# Patient Record
Sex: Female | Born: 1965 | Race: White | Hispanic: No | Marital: Married | Smoking: Light tobacco smoker
Health system: Southern US, Community
[De-identification: ages and names within clinical notes are randomized; demographics above are authoritative.]

## PROBLEM LIST (undated history)

## (undated) DIAGNOSIS — K219 Gastro-esophageal reflux disease without esophagitis: Secondary | ICD-10-CM

## (undated) DIAGNOSIS — R030 Elevated blood-pressure reading, without diagnosis of hypertension: Secondary | ICD-10-CM

## (undated) DIAGNOSIS — Z86010 Personal history of colonic polyps: Secondary | ICD-10-CM

## (undated) DIAGNOSIS — M6289 Other specified disorders of muscle: Secondary | ICD-10-CM

## (undated) DIAGNOSIS — IMO0002 Reserved for concepts with insufficient information to code with codable children: Secondary | ICD-10-CM

## (undated) HISTORY — DX: Personal history of colonic polyps: Z86.010

## (undated) HISTORY — DX: Elevated blood-pressure reading, without diagnosis of hypertension: R03.0

## (undated) HISTORY — DX: Reserved for concepts with insufficient information to code with codable children: IMO0002

## (undated) HISTORY — DX: Gastro-esophageal reflux disease without esophagitis: K21.9

## (undated) HISTORY — DX: Other specified disorders of muscle: M62.89

---
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
## 1993-11-22 HISTORY — PX: OTHER SURGICAL HISTORY: SHX169

## 2009-09-18 ENCOUNTER — Encounter: Payer: Self-pay | Admitting: Family Medicine

## 2009-09-18 ENCOUNTER — Ambulatory Visit: Payer: Self-pay | Admitting: Family Medicine

## 2009-09-18 DIAGNOSIS — K219 Gastro-esophageal reflux disease without esophagitis: Secondary | ICD-10-CM | POA: Insufficient documentation

## 2009-09-18 DIAGNOSIS — Z860100 Personal history of colon polyps, unspecified: Secondary | ICD-10-CM | POA: Insufficient documentation

## 2009-09-18 DIAGNOSIS — I10 Essential (primary) hypertension: Secondary | ICD-10-CM | POA: Insufficient documentation

## 2009-09-18 DIAGNOSIS — E282 Polycystic ovarian syndrome: Secondary | ICD-10-CM | POA: Insufficient documentation

## 2009-09-18 DIAGNOSIS — G43009 Migraine without aura, not intractable, without status migrainosus: Secondary | ICD-10-CM | POA: Insufficient documentation

## 2009-09-18 DIAGNOSIS — IMO0002 Reserved for concepts with insufficient information to code with codable children: Secondary | ICD-10-CM | POA: Insufficient documentation

## 2009-09-18 DIAGNOSIS — Z9189 Other specified personal risk factors, not elsewhere classified: Secondary | ICD-10-CM | POA: Insufficient documentation

## 2010-03-02 ENCOUNTER — Ambulatory Visit: Payer: Self-pay | Admitting: Family Medicine

## 2010-03-02 DIAGNOSIS — M79609 Pain in unspecified limb: Secondary | ICD-10-CM | POA: Insufficient documentation

## 2010-04-09 ENCOUNTER — Ambulatory Visit: Payer: Self-pay | Admitting: Family Medicine

## 2010-04-09 ENCOUNTER — Other Ambulatory Visit: Admission: RE | Admit: 2010-04-09 | Discharge: 2010-04-09 | Payer: Self-pay | Admitting: Family Medicine

## 2010-04-09 DIAGNOSIS — N920 Excessive and frequent menstruation with regular cycle: Secondary | ICD-10-CM | POA: Insufficient documentation

## 2010-04-09 DIAGNOSIS — IMO0001 Reserved for inherently not codable concepts without codable children: Secondary | ICD-10-CM | POA: Insufficient documentation

## 2010-04-10 LAB — CONVERTED CEMR LAB
ALT: 28 units/L (ref 0–35)
AST: 22 units/L (ref 0–37)
Alkaline Phosphatase: 47 units/L (ref 39–117)
BUN: 18 mg/dL (ref 6–23)
Chloride: 100 meq/L (ref 96–112)
Cholesterol: 147 mg/dL (ref 0–200)
GFR calc non Af Amer: 112.43 mL/min (ref >60)
LDL Cholesterol: 86 mg/dL (ref 0–99)
Sodium: 137 meq/L (ref 135–145)
Triglycerides: 167 mg/dL — ABNORMAL HIGH (ref 0.0–149.0)

## 2010-04-16 ENCOUNTER — Encounter: Payer: Self-pay | Admitting: Family Medicine

## 2010-09-10 ENCOUNTER — Encounter (INDEPENDENT_AMBULATORY_CARE_PROVIDER_SITE_OTHER): Payer: Self-pay | Admitting: *Deleted

## 2010-10-12 ENCOUNTER — Telehealth: Payer: Self-pay | Admitting: Family Medicine

## 2010-11-12 ENCOUNTER — Telehealth: Payer: Self-pay | Admitting: Family Medicine

## 2010-12-13 ENCOUNTER — Encounter: Payer: Self-pay | Admitting: Family Medicine

## 2010-12-22 NOTE — Progress Notes (Signed)
Summary: tramadol   Phone Note Refill Request Message from:  Fax from Pharmacy on October 12, 2010 9:51 AM  Refills Requested: Medication #1:  TRAMADOL HCL 50 MG  TABS 1 tab by mouth two times a day as needed pain   Last Refilled: 09/03/2010 [facility] [phone].   Initial call taken by: Melody Comas,  October 12, 2010 9:52 AM  Follow-up for Phone Call        sent. Follow-up by: Crawford Givens MD,  October 13, 2010 3:01 PM    Prescriptions: TRAMADOL HCL 50 MG  TABS (TRAMADOL HCL) 1 tab by mouth two times a day as needed pain  #60 x 3   Entered and Authorized by:   Crawford Givens MD   Signed by:   Crawford Givens MD on 10/13/2010   Method used:   Electronically to        [facility] (retail)       [address]       [place], [place]  [address]       Ph: [phone]       Fax: [phone]   RxID:   [id number]

## 2010-12-22 NOTE — Assessment & Plan Note (Signed)
Summary: CPX,TRANSFER FROM [facility]   Vital Signs:  Patient profile:   45 year old female Height:      65 inches Weight:      252.13 pounds BMI:     42.11 Temp:     98.3 degrees F oral Pulse rate:   74 / minute Pulse rhythm:   regular BP sitting:   122 / 90  (left arm) Cuff size:   large  Vitals Entered By: Linde Gillis CMA Duncan Dull) (Apr 09, 2010 8:22 AM) CC: complete physical with pap, transfer from [place]   History of Present Illness: 45 yo here to establish care/CPX.  G0, four adopted children. Has very strong FH of endometrial cancer, mom died in 9s, grandmother and sister had endometrial CA as well. Has h/o menorrhagia s/p wedge rescetion in 1995. H/o abnormal pap smear over 10 years ago. She is very interested in prophylactic hysterectomy.  Fibromyalgia- has been on multiple antidepressants at first diagnosis, nothing helped. Pain in shoulder, thighs.  Combination of achy, sometimes burning, tingling. Taking 2400 mg of Ibuprofen daily.  Well woman- due for mammogram, pap, lipid panel.    Current Medications (verified): 1)  Toprol Xl 50 Mg Xr24h-Tab (Metoprolol Succinate) .... One A Day 2)  Hydrochlorothiazide 25 Mg Tabs (Hydrochlorothiazide) .... Once Daily 3)  Baby Aspirin 81 Mg Chew (Aspirin) .... One A Day 4)  Ibuprofen 200 Mg Tabs (Ibuprofen) .... She Takes 800-1000mg  As Needed 5)  Tramadol Hcl 50 Mg  Tabs (Tramadol Hcl) .Marland Kitchen.. 1 Tab By Mouth Two Times A Day As Needed Pain  Allergies: 1)  ! Penicillin 2)  ! Neomycin 3)  ! Entex  Past History:  Past Medical History: Last updated: 09/18/2009 Current Problems:  COLONIC POLYPS, HX OF (ICD-V12.72) ELEVATED BLOOD PRESSURE WITHOUT DIAGNOSIS OF HYPERTENSION (ICD-796.2) GERD (ICD-530.81) COMMON MIGRAINE (ICD-346.10) CHICKENPOX, HX OF (ICD-V15.9)  Pelvic floor dysfunction dengenerative disc disease  Past Surgical History: Last updated: 09/18/2009 knee, right...benign tumor 1982 sinus 1984 ovarian wedge  resection 1995 tonsil and adenoids 1979 MRI 2010 : lumbar DDD, no surgical indication.  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 Married Never Smoked Alcohol use-yes Drug use-no Regular exercise-no Diet: healthy, all natural.  Risk Factors: Exercise: no (09/18/2009)  Risk Factors: Smoking Status: never (09/18/2009)  Family History: Family History of Arthritis Family History Ovarian cancer Family History Uterine cancer- mother, sister, grandmother Child with mitochondrial disease...adopted son..complex 1 and 4 MGM: breast cancer, colon cancer, multiple myeloma  Social History: Reviewed history from 09/18/2009 and no changes required. 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 Married Never Smoked Alcohol use-yes Drug use-no Regular exercise-no Diet: healthy, all natural.  Review of Systems      See HPI General:  Complains of fatigue. Eyes:  Denies blurring. ENT:  Denies difficulty swallowing. CV:  Denies chest pain or discomfort. Resp:  Denies shortness of breath. GI:  Denies abdominal pain, change in bowel habits, nausea, and vomiting. GU:  Denies abnormal vaginal bleeding, discharge, and dysuria. MS:  Denies joint pain, joint redness, and joint swelling. Derm:  Denies rash. Neuro:  Denies headaches. Psych:  Denies anxiety, depression, sense of great danger, suicidal thoughts/plans, thoughts of violence, unusual visions or sounds, and thoughts /plans of harming others. Endo:  Denies cold intolerance and heat intolerance. Heme:  Denies  abnormal bruising.  Physical Exam  General:  obese appearing female in NAd Head:  normocephalic and  atraumatic.   Eyes:  vision grossly intact, pupils equal, and pupils round.   Ears:  R ear normal and L ear normal.   Nose:  no external deformity.   Mouth:  good dentition and no gingival abnormalities.   Breasts:  No mass, nodules, thickening, tenderness, bulging, retraction, inflamation, nipple discharge or skin changes noted.   Lungs:  Normal respiratory effort, chest expands symmetrically. Lungs are clear to auscultation, no crackles or wheezes. Heart:  Normal rate and regular rhythm. S1 and S2 normal without gallop, murmur, click, rub or other extra sounds. Abdomen:  Bowel sounds positive,abdomen soft and non-tender without masses, organomegaly or hernias noted. Genitalia:  Pelvic Exam:        External: normal female genitalia without lesions or masses        Vagina: normal without lesions or masses        Cervix: normal without lesions or masses        Adnexa: normal bimanual exam without masses or fullness        Uterus: normal by palpation        Pap smear: performed Extremities:  No clubbing, cyanosis, edema, or deformity noted with normal full range of motion of all joints.   Neurologic:  cranial nerves II-XII intact, strength normal in all extremities, and sensation intact to light touch.   Skin:  Intact without suspicious lesions or rashes Psych:  Cognition and judgment appear intact. Alert and cooperative with normal attention span and concentration. No apparent delusions, illusions, hallucinations   Impression & Recommendations:  Problem # 1:  Preventive Health Care (ICD-V70.0) Reviewed preventive care protocols, scheduled due services, and updated immunizations Discussed nutrition, exercise, diet, and healthy lifestyle.  Pap, FLP, hepatic panel, BMET today. Set up mammogram today.  Problem # 2:  ESSENTIAL HYPERTENSION, BENIGN (ICD-401.1) Assessment: Unchanged Stable.  Continue current meds. Her updated medication list for this problem includes:    Toprol Xl 50 Mg  Xr24h-tab (Metoprolol succinate) ..... One a day    Hydrochlorothiazide 25 Mg Tabs (Hydrochlorothiazide) ..... Once daily  Orders: Venipuncture (21308) TLB-BMP (Basic Metabolic Panel-BMET) (80048-METABOL)  Problem # 3:  MENORRHAGIA (ICD-626.2) Assessment: Deteriorated Worsening along with pt desire to have prophylactic hysterectomy (stong FH endometrial CA).  Will refer to gyn. Orders: Gynecologic Referral (Gyn)  Problem # 4:  UNSPECIFIED MYALGIA AND MYOSITIS (ICD-729.1) Discussed Fibromyalgia, given pt handout concerning exercises, supplements. Check Vit D. Advised against using so much Ibuprofen, will try Tramadol. Her updated medication list for this problem includes:    Baby Aspirin 81 Mg Chew (Aspirin) ..... One a day    Ibuprofen 200 Mg Tabs (Ibuprofen) ..... She takes 800-1000mg  as needed    Tramadol Hcl 50 Mg Tabs (Tramadol hcl) .Marland Kitchen... 1 tab by mouth two times a day as needed pain  Orders: Venipuncture (65784) T-Vitamin D (25-Hydroxy) (69629-52841)  Complete Medication List: 1)  Toprol Xl 50 Mg Xr24h-tab (Metoprolol succinate) .... One a day 2)  Hydrochlorothiazide 25 Mg Tabs (Hydrochlorothiazide) .... Once daily 3)  Baby Aspirin 81 Mg Chew (Aspirin) .... One a day 4)  Ibuprofen 200 Mg Tabs (Ibuprofen) .... She takes 800-1000mg  as needed 5)  Tramadol Hcl 50 Mg Tabs (Tramadol hcl) .Marland Kitchen.. 1 tab by mouth two times a day as needed pain  Other Orders: Pap Smear, Thin Prep ( Collection of) (L2440) TLB-Lipid Panel (80061-LIPID) TLB-Hepatic/Liver Function Pnl (80076-HEPATIC) Radiology Referral (Radiology)  Patient Instructions: 1)  Great to see you. 2)  Please stop by to see Shirlee Limerick on your way out to set up your mammogram and Gyn referral. Prescriptions: TRAMADOL HCL 50 MG  TABS (TRAMADOL HCL) 1 tab by mouth two times a day as needed pain  #60 x 3   Entered and Authorized by:   Ruthe Mannan MD   Signed by:   Ruthe Mannan MD on 04/09/2010   Method used:   Electronically to         [facility] (retail)       [address]       [place], [place]  [address]       Ph: [phone]       Fax: [phone]   RxID:   [id number]   Current Allergies (reviewed today): ! PENICILLIN ! NEOMYCIN ! ENTEX

## 2010-12-22 NOTE — Letter (Signed)
Summary: Primary Care Appointment Letter  [place] [facility]  [address] [place], [place] [address]   Phone: [phone]  Fax: [phone]    09/10/2010 MRN: [id number]  [facility] [address] [place]  [address]  Dear Ms. Laffey,   Your Primary Care Physician Kerby Nora MD has indicated that:    _______it is time to schedule an appointment.    __x_____you missed your appointment for your Mammogram and need to call           and reschedule.    _______you need to have lab work done.    _______you need to schedule an appointment discuss lab or test results.    _______you need to call to reschedule your appointment that is                       scheduled on _________.     Please call our office as soon as possible. Our phone number is [phone]. Please press option 1. Our office is open 8a-12noon and 1p-5p, Monday through Friday.     Thank you,    [place] Sched[facility]

## 2010-12-22 NOTE — Assessment & Plan Note (Signed)
Summary: high blood pressure/alc   Vital Signs:  Patient profile:   45 year old female Height:      65 inches Weight:      252.13 pounds BMI:     42.11 Temp:     98.2 degrees F oral Pulse rate:   84 / minute Pulse rhythm:   regular BP sitting:   156 / 94  (left arm) Cuff size:   large  Vitals Entered By: Delilah Shan CMA Duncan Dull) (March 02, 2010 3:56 PM)  Serial Vital Signs/Assessments:  Time      Position  BP       Pulse  Resp  Temp     By                     124/83                         Ruthe Mannan MD  CC: High BP - Requests EKG   History of Present Illness: 45 yo new to me here for ? elevated blood pressure. She wants an EKG because her arm was tingling earlier today (left), now resolved. no CP or SOB.  No h/o CAD.  BP at home today was 111/71.  Rushed in today was very nervous.  Repeat BP was normal  BP has been well controlled with HCTZ 25 mg daily and Toprol XL 50 mg daily.  Current Medications (verified): 1)  Toprol Xl 50 Mg Xr24h-Tab (Metoprolol Succinate) .... One A Day 2)  Hydrochlorothiazide 25 Mg Tabs (Hydrochlorothiazide) .... Once Daily 3)  Baby Aspirin 81 Mg Chew (Aspirin) .... One A Day 4)  Ibuprofen 200 Mg Tabs (Ibuprofen) .... She Takes 800-1000mg  As Needed  Allergies: 1)  ! Penicillin 2)  ! Neomycin 3)  ! Entex  Review of Systems      See HPI CV:  Denies chest pain or discomfort, difficulty breathing at night, fainting, lightheadness, and shortness of breath with exertion. GI:  Denies nausea and vomiting.  Physical Exam  General:  obese appearing female in NAd Lungs:  Normal respiratory effort, chest expands symmetrically. Lungs are clear to auscultation, no crackles or wheezes. Heart:  Normal rate and regular rhythm. S1 and S2 normal without gallop, murmur, click, rub or other extra sounds. Msk:  bilateral normal UE strength, full ROM. Neg empty can, neg arch. Extremities:  no edema Psych:  Cognition and judgment appear intact. Alert and  cooperative with normal attention span and concentration. No apparent delusions, illusions, hallucinations   Impression & Recommendations:  Problem # 1:  ESSENTIAL HYPERTENSION, BENIGN (ICD-401.1) Assessment Unchanged stable.  Reassurance provided.  Continue current meds. Her updated medication list for this problem includes:    Toprol Xl 50 Mg Xr24h-tab (Metoprolol succinate) ..... One a day    Hydrochlorothiazide 25 Mg Tabs (Hydrochlorothiazide) ..... Once daily  Problem # 2:  ARM PAIN, LEFT (ICD-729.5) Assessment: New  Resolved. Reassurance provided.  EKG normal.  Red flags given requiring immediate evalutation.  Orders: EKG w/ Interpretation (93000)  Complete Medication List: 1)  Toprol Xl 50 Mg Xr24h-tab (Metoprolol succinate) .... One a day 2)  Hydrochlorothiazide 25 Mg Tabs (Hydrochlorothiazide) .... Once daily 3)  Baby Aspirin 81 Mg Chew (Aspirin) .... One a day 4)  Ibuprofen 200 Mg Tabs (Ibuprofen) .... She takes 800-1000mg  as needed  Current Allergies (reviewed today): ! PENICILLIN ! NEOMYCIN ! ENTEX

## 2010-12-22 NOTE — Letter (Signed)
Summary: Results Follow up Letter  [place] [facility]  [address] [place], [place] [address]   Phone: [phone]  Fax: [phone]    04/16/2010 MRN: [id number]  [facility] [address], [place]  [address]  Dear Ms. Harsha,  The following are the results of your recent test(s):  Test         Result    Pap Smear:        Normal __X___  Not Normal _____ Comments: ______________________________________________________ Cholesterol: LDL(Bad cholesterol):         Your goal is less than:         HDL (Good cholesterol):       Your goal is more than: Comments:  ______________________________________________________ Mammogram:        Normal _____  Not Normal _____ Comments:  ___________________________________________________________________ Hemoccult:        Normal _____  Not normal _______ Comments:    _____________________________________________________________________ Other Tests:    We routinely do not discuss normal results over the telephone.  If you desire a copy of the results, or you have any questions about this information we can discuss them at your next office visit.   Sincerely,        Ruthe Mannan, MD

## 2010-12-24 NOTE — Progress Notes (Signed)
Summary: Pt has not responded to Mammogram referral...  Phone Note Outgoing Call   Summary of Call: Pt has been called and ltr mailed regarding rescheduled mammogram.  Pt has not called up back.Marland KitchenMarland KitchenDaine Gip  November 12, 2010 2:57 PM  Initial call taken by: Daine Gip,  November 12, 2010 2:57 PM  Follow-up for Phone Call        Aware. Follow-up by: Kerby Nora MD,  November 16, 2010 12:25 AM

## 2011-05-11 ENCOUNTER — Encounter: Payer: Self-pay | Admitting: Family Medicine

## 2011-05-11 LAB — HM PAP SMEAR

## 2011-05-13 ENCOUNTER — Encounter: Payer: Self-pay | Admitting: Family Medicine

## 2011-05-13 ENCOUNTER — Ambulatory Visit (INDEPENDENT_AMBULATORY_CARE_PROVIDER_SITE_OTHER): Payer: BC Managed Care – PPO | Admitting: Family Medicine

## 2011-05-13 VITALS — BP 120/82 | HR 86 | Temp 98.3°F | Wt 232.5 lb

## 2011-05-13 DIAGNOSIS — Z113 Encounter for screening for infections with a predominantly sexual mode of transmission: Secondary | ICD-10-CM

## 2011-05-13 DIAGNOSIS — Z309 Encounter for contraceptive management, unspecified: Secondary | ICD-10-CM | POA: Insufficient documentation

## 2011-05-13 DIAGNOSIS — E282 Polycystic ovarian syndrome: Secondary | ICD-10-CM

## 2011-05-13 DIAGNOSIS — N920 Excessive and frequent menstruation with regular cycle: Secondary | ICD-10-CM

## 2011-05-13 MED ORDER — NORETHINDRONE 0.35 MG PO TABS: 1.0000 | ORAL_TABLET | Freq: Every day | ORAL | Status: DC

## 2011-05-13 NOTE — Progress Notes (Signed)
45 yo here to discuss OCPs.  Divorced from husband, has new boyfriend. She has had issues with infertility but wants to start OCPs to make sure they don't get pregnant. Periods are irregular, not too heavy at this point. She is a non smoker. Tried Loestrin in past, made her vomit.  Patient Active Problem List  Diagnoses  . POLYCYSTIC OVARIAN DISEASE  . COMMON MIGRAINE  . ESSENTIAL HYPERTENSION, BENIGN  . GERD  . MENORRHAGIA  . DEGENERATIVE DISC DISEASE  . UNSPECIFIED MYALGIA AND MYOSITIS  . ARM PAIN, LEFT  . COLONIC POLYPS, HX OF  . CHICKENPOX, HX OF  . Contraception management   Past Medical History  Diagnosis Date  . Personal history of colonic polyps   . Elevated blood pressure reading without diagnosis of hypertension   . Esophageal reflux   . DDD (degenerative disc disease)   . Pelvic floor dysfunction    Past Surgical History  Procedure Date  . 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 Sinus surgery with instatrak 1984  . Ovarian wedge resection 1995  . Tonsillectomy and adenoidectomy 1979   History  Substance Use Topics  . Smoking status: Never Smoker   . Smokeless tobacco: Not on file  . Alcohol Use: Yes   Family History  Problem Relation Age of Onset  . Cancer Mother     uterine  . Cancer Sister     uterine  . Cancer Maternal Grandmother     uterine, breast, colon, multiple myeloma  . Cancer Other     ovarian  . Arthritis Other    Allergies  Allergen Reactions  . Entex   . Neomycin   . Penicillins    Current Outpatient Prescriptions on File Prior to Visit  Medication Sig Dispense Refill  . BABY ASPIRIN PO Take 1 tablet by mouth daily.        . cholecalciferol (VITAMIN D) 1000 UNITS tablet Take 1,000 Units by mouth daily.        . hydrochlorothiazide 25 MG tablet Take 25 mg by mouth daily.        Marland Kitchen ibuprofen (ADVIL,MOTRIN) 200 MG tablet Take 800-1000mg  as needed       . metoprolol (TOPROL-XL) 50 MG 24 hr tablet Take 50 mg by mouth daily.         . traMADol (ULTRAM) 50 MG tablet Take 50 mg by mouth 2 (two) times daily as needed.         The PMH, PSH, Social History, Family History, Medications, and allergies have been reviewed in [facility], and have been updated if relevant.   Review of Systems  See HPI  General: Complains of fatigue.  Eyes: Denies blurring.  ENT: Denies difficulty swallowing.  CV: Denies chest pain or discomfort.  Resp: Denies shortness of breath.  GI: Denies abdominal pain, change in bowel habits, nausea, and vomiting.  GU: Denies abnormal vaginal bleeding, discharge, and dysuria.  MS: Denies joint pain, joint redness, and joint swelling.  Derm: Denies rash.  Neuro: Denies headaches.  Psych: Denies anxiety, depression, sense of great danger, suicidal thoughts/plans, thoughts of violence, unusual visions or sounds, and thoughts /plans of harming others.  Endo: Denies cold intolerance and heat intolerance.  Heme: Denies abnormal bruising.   Physical exam: BP 120/82  Pulse 86  Temp(Src) 98.3 F (36.8 C) (Oral)  Wt 232 lb 8 oz (105.461 kg)  LMP 04/25/2011  General: obese appearing female in NAd  Psych: Cognition and judgment appear intact.  Alert and cooperative with normal attention span and concentration. No apparent delusions, illusions, hallucinations   1 Contraception management  Discussed different options. >25 min spent with face to face with patient, >50% counseling and/or coordinating care- she would like to try minipill. Rx given.     2. Screening for STD (sexually transmitted disease)  RPR, HIV Antibody ( Reflex)

## 2011-05-14 LAB — HIV ANTIBODY (ROUTINE TESTING W REFLEX): HIV: NONREACTIVE

## 2011-07-05 ENCOUNTER — Telehealth: Payer: Self-pay | Admitting: *Deleted

## 2011-07-05 NOTE — Telephone Encounter (Signed)
Returned pt's phone call. She cannot tolerate anything with estrogen.  I recommended that she see her GYN to consider paraguard. She agree and will call. She is no longer depressed since she stopped the micronor.

## 2011-07-05 NOTE — Telephone Encounter (Signed)
Pt has been taking progesterone only BC pills but she says she cant tolerate these.  They are causing her to be depressed and she had a very heavy period.  She says she's willing to give more time regarding her periods, but she cant live with the depression.  She thinks maybe she should try something else.  She is asking that you call her regarding this.  [facility] [place].

## 2011-07-06 ENCOUNTER — Other Ambulatory Visit: Payer: Self-pay | Admitting: Family Medicine

## 2011-10-22 ENCOUNTER — Other Ambulatory Visit: Payer: Self-pay | Admitting: Family Medicine

## 2012-03-20 ENCOUNTER — Other Ambulatory Visit: Payer: Self-pay | Admitting: Family Medicine

## 2012-03-21 NOTE — Telephone Encounter (Signed)
Overdue for CPX.Marland Kitchenrefill once appt made to last until appt.

## 2012-04-19 ENCOUNTER — Encounter: Payer: BC Managed Care – PPO | Admitting: Family Medicine

## 2012-06-06 ENCOUNTER — Encounter: Payer: BC Managed Care – PPO | Admitting: Family Medicine

## 2012-06-06 DIAGNOSIS — Z0000 Encounter for general adult medical examination without abnormal findings: Secondary | ICD-10-CM | POA: Insufficient documentation

## 2012-06-07 ENCOUNTER — Other Ambulatory Visit: Payer: Self-pay | Admitting: Family Medicine

## 2012-09-29 ENCOUNTER — Telehealth: Payer: Self-pay | Admitting: Family Medicine

## 2012-09-29 NOTE — Telephone Encounter (Signed)
Appt scheduled for next week

## 2012-09-29 NOTE — Telephone Encounter (Signed)
Pt called and wants to know if she can get a CPE apptmt w/out waiting several weeks.  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 She wants to know if there is a way we could schedule her for a CPE for only a day or two out, instead of several weeks? Please advise. Thank you.

## 2012-09-29 NOTE — Telephone Encounter (Signed)
We can try but the schedule often fills up quickly during this time of year.  Ask her to call when she would like and we can try to fit her in.

## 2012-10-05 ENCOUNTER — Ambulatory Visit (INDEPENDENT_AMBULATORY_CARE_PROVIDER_SITE_OTHER): Payer: BC Managed Care – PPO | Admitting: Family Medicine

## 2012-10-05 ENCOUNTER — Encounter: Payer: Self-pay | Admitting: Family Medicine

## 2012-10-05 VITALS — BP 160/90 | HR 84 | Temp 98.9°F | Ht 64.5 in | Wt 214.0 lb

## 2012-10-05 DIAGNOSIS — R5383 Other fatigue: Secondary | ICD-10-CM

## 2012-10-05 DIAGNOSIS — Z309 Encounter for contraceptive management, unspecified: Secondary | ICD-10-CM

## 2012-10-05 DIAGNOSIS — Z1231 Encounter for screening mammogram for malignant neoplasm of breast: Secondary | ICD-10-CM

## 2012-10-05 DIAGNOSIS — Z136 Encounter for screening for cardiovascular disorders: Secondary | ICD-10-CM

## 2012-10-05 DIAGNOSIS — Z113 Encounter for screening for infections with a predominantly sexual mode of transmission: Secondary | ICD-10-CM

## 2012-10-05 DIAGNOSIS — Z0000 Encounter for general adult medical examination without abnormal findings: Secondary | ICD-10-CM

## 2012-10-05 DIAGNOSIS — R5381 Other malaise: Secondary | ICD-10-CM

## 2012-10-05 DIAGNOSIS — I10 Essential (primary) hypertension: Secondary | ICD-10-CM

## 2012-10-05 LAB — TSH: TSH: 1.14 u[IU]/mL (ref 0.35–5.50)

## 2012-10-05 LAB — COMPREHENSIVE METABOLIC PANEL
Albumin: 4.2 g/dL (ref 3.5–5.2)
BUN: 15 mg/dL (ref 6–23)
Calcium: 8.9 mg/dL (ref 8.4–10.5)
Chloride: 104 mEq/L (ref 96–112)
Creatinine, Ser: 0.7 mg/dL (ref 0.4–1.2)
Glucose, Bld: 104 mg/dL — ABNORMAL HIGH (ref 70–99)
Potassium: 4.1 mEq/L (ref 3.5–5.1)

## 2012-10-05 MED ORDER — NORETHIN-ETH ESTRAD-FE BIPHAS 1 MG-10 MCG / 10 MCG PO TABS: 1.0000 | ORAL_TABLET | Freq: Every day | ORAL | Status: DC

## 2012-10-05 MED ORDER — HYDROCHLOROTHIAZIDE 25 MG PO TABS: ORAL_TABLET | ORAL | Status: DC

## 2012-10-05 MED ORDER — METRONIDAZOLE 1 % EX GEL: Freq: Every day | CUTANEOUS | Status: AC

## 2012-10-05 NOTE — Patient Instructions (Addendum)
Good to see you. We will call you with your lab results.  Have fun!!!

## 2012-10-05 NOTE — Progress Notes (Signed)
Subjective:   HT Patient ID: Brittany Landry, female    DOB: 24-Mar-1966, 46 y.o.   MRN: [id number]  HPI 46 yo here for CPX.  G0, four adopted children.  Divorced and dating!  Doing well. Wants to be tested for HIV, RPR. No dysuria or vaginal discharge.  Has very strong FH of endometrial cancer, mom died in 63s, grandmother and sister had endometrial CA as well.  Has h/o menorrhagia s/p wedge rescetion in 1995.  H/o abnormal pap smear over 10 years ago.   Last pap smear done here was neg in 03/2010.  She states that she had a pap smear last June that was neg with OBGYN. On Lo loestrin for birth control.  HTN- BP elevated today.  On HCTZ 25 mg daily but ran out months ago. No HA, blurred vision, CP or SOB.     Well woman- due for mammogram,  lipid panel, flu shot.  Rash on her chin- noticed it several months ago.  A little itchy.  Hydrocortisone actually made it worse. Patient Active Problem List  Diagnosis  . POLYCYSTIC OVARIAN DISEASE  . COMMON MIGRAINE  . ESSENTIAL HYPERTENSION, BENIGN  . GERD  . MENORRHAGIA  . DEGENERATIVE DISC DISEASE  . UNSPECIFIED MYALGIA AND MYOSITIS  . ARM PAIN, LEFT  . COLONIC POLYPS, HX OF  . CHICKENPOX, HX OF  . Contraception management  . Routine general medical examination at a health care facility   Past Medical History  Diagnosis Date  . Personal history of colonic polyps   . Elevated blood pressure reading without diagnosis of hypertension   . Esophageal reflux   . DDD (degenerative disc disease)   . Pelvic floor dysfunction    Past Surgical History  Procedure Date  . 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 Sinus surgery with instatrak 1984  . Ovarian wedge resection 1995  . Tonsillectomy and adenoidectomy 1979   History  Substance Use Topics  . Smoking status: Never Smoker   . Smokeless tobacco: Not on file  . Alcohol Use: Yes   Family History  Problem Relation Age of Onset  . Cancer Mother     uterine  . Cancer Sister      uterine  . Cancer Maternal Grandmother     uterine, breast, colon, multiple myeloma  . Cancer Other     ovarian  . Arthritis Other    Allergies  Allergen Reactions  . Entex   . Neomycin   . Penicillins    Current Outpatient Prescriptions on File Prior to Visit  Medication Sig Dispense Refill  . BABY ASPIRIN PO Take 1 tablet by mouth daily.        . cholecalciferol (VITAMIN D) 1000 UNITS tablet Take 1,000 Units by mouth daily.        . hydrochlorothiazide (HYDRODIURIL) 25 MG tablet TAKE ONE TABLET BY MOUTH ONE TIME DAILY  30 tablet  0  . ibuprofen (ADVIL,MOTRIN) 200 MG tablet Take 800-1000mg  as needed       . metoprolol (TOPROL-XL) 50 MG 24 hr tablet Take 50 mg by mouth daily.        . norethindrone (ORTHO MICRONOR) 0.35 MG tablet Take 1 tablet (0.35 mg total) by mouth daily.  30 tablet  11  . traMADol (ULTRAM) 50 MG tablet Take 50 mg by mouth 2 (two) times daily as needed.         The PMH, PSH, Social History, Family History, Medications, and allergies have been  reviewed in [facility], and have been updated if relevant.     Review of Systems See HPI Patient reports no  vision/ hearing changes,anorexia, weight change, fever ,adenopathy, persistant / recurrent hoarseness, swallowing issues, chest pain, edema,persistant / recurrent cough, hemoptysis, dyspnea(rest, exertional, paroxysmal nocturnal), gastrointestinal  bleeding (melena, rectal bleeding), abdominal pain, excessive heart burn, GU symptoms(dysuria, hematuria, pyuria, voiding/incontinence  Issues) syncope, focal weakness, severe memory loss, concerning skin lesions, depression, anxiety, abnormal bruising/bleeding, major joint swelling, breast masses or abnormal vaginal bleeding.       Objective:   Physical Exam BP 160/90  Pulse 84  Temp 98.9 F (37.2 C)  Ht 5' 4.5" (1.638 m)  Wt 214 lb (97.07 kg)  BMI 36.17 kg/m2  General:  Well-developed,well-nourished,in no acute distress; alert,appropriate and cooperative throughout  examination Head:  normocephalic and atraumatic.   Eyes:  vision grossly intact, pupils equal, pupils round, and pupils reactive to light.   Ears:  R ear normal and L ear normal.   Nose:  no external deformity.   Mouth:  good dentition.   Neck:  No deformities, masses, or tenderness noted. Breasts:  No mass, nodules, thickening, tenderness, bulging, retraction, inflamation, nipple discharge or skin changes noted.   Lungs:  Normal respiratory effort, chest expands symmetrically. Lungs are clear to auscultation, no crackles or wheezes. Heart:  Normal rate and regular rhythm. S1 and S2 normal without gallop, murmur, click, rub or other extra sounds. Abdomen:  Bowel sounds positive,abdomen soft and non-tender without masses, organomegaly or hernias noted. Msk:  No deformity or scoliosis noted of thoracic or lumbar spine.   Extremities:  No clubbing, cyanosis, edema, or deformity noted with normal full range of motion of all joints.   Neurologic:  alert & oriented X3 and gait normal.   Skin:  Erythematous, raised macular rash on chin, a little on her cheeks bilaterally Cervical Nodes:  No lymphadenopathy noted Axillary Nodes:  No palpable lymphadenopathy Psych:  Cognition and judgment appear intact. Alert and cooperative with normal attention span and concentration. No apparent delusions, illusions, hallucinations        Assessment & Plan:   1. Routine general medical examination at a health care facility  Reviewed preventive care protocols, scheduled due services, and updated immunizations Discussed nutrition, exercise, diet, and healthy lifestyle.  Comprehensive metabolic panel  2. Contraception management  Continue Lo loestrin.   3. Essential hypertension, benign  Deteriorated.  Refilled HCTZ.  Check CMET. She is asymptomatic.   4. Other screening mammogram  MM Digital Screening  5. Screening for STD (sexually transmitted disease)  HIV Antibody, RPR  6. Screening for ischemic heart  disease  Lipid Panel  7. Rash ?Rosacea.  Will try topical metrogel. Call or return to clinic prn if these symptoms worsen or fail to improve as anticipated.

## 2012-10-06 LAB — RPR

## 2012-10-06 LAB — HIV ANTIBODY (ROUTINE TESTING W REFLEX): HIV: NONREACTIVE

## 2012-10-23 ENCOUNTER — Telehealth: Payer: Self-pay

## 2012-10-23 DIAGNOSIS — N6459 Other signs and symptoms in breast: Secondary | ICD-10-CM

## 2012-10-23 NOTE — Telephone Encounter (Signed)
Order entered

## 2012-10-23 NOTE — Telephone Encounter (Signed)
Pt left v/m needs diagnostic mammogram at [facility];pt said at 10/05/12 appt Dr Dayton Martes thought felt something in breast but decided milk gland;pt has noticed slight pulling sensation and inversion of nipple. Please advise.

## 2012-10-23 NOTE — Telephone Encounter (Signed)
Pt states left breast.

## 2012-10-23 NOTE — Telephone Encounter (Signed)
I can place order.  Please verify which breast.

## 2012-10-24 ENCOUNTER — Other Ambulatory Visit: Payer: Self-pay | Admitting: Family Medicine

## 2012-10-24 DIAGNOSIS — N6459 Other signs and symptoms in breast: Secondary | ICD-10-CM

## 2012-10-24 DIAGNOSIS — Z1239 Encounter for other screening for malignant neoplasm of breast: Secondary | ICD-10-CM

## 2012-10-25 ENCOUNTER — Ambulatory Visit: Payer: Self-pay | Admitting: Family Medicine

## 2012-10-27 ENCOUNTER — Telehealth: Payer: Self-pay | Admitting: Family Medicine

## 2012-10-27 ENCOUNTER — Encounter: Payer: Self-pay | Admitting: Family Medicine

## 2012-10-27 DIAGNOSIS — N6459 Other signs and symptoms in breast: Secondary | ICD-10-CM

## 2012-10-27 NOTE — Telephone Encounter (Signed)
Please call pt to let her know that I reviewed her mammogram and ultrasound from [facility], both of which were normal.  Because she did complain to the tech about nipple inversion and discomfort, they are recommending a surgical consultation for further evaluation.  This is just to make sure that we do not need to get a biopsy.  Referral placed.

## 2012-10-30 NOTE — Telephone Encounter (Signed)
Please follow up on this.  I think she was already informed. Thanks.

## 2012-10-31 NOTE — Telephone Encounter (Signed)
Appt made with [facility] Surgeon Dr Stephan Minister on 11/06/2012 and patient is aware.

## 2012-10-31 NOTE — Telephone Encounter (Signed)
Pt has been informed about this.

## 2012-11-07 ENCOUNTER — Encounter: Payer: Self-pay | Admitting: Family Medicine

## 2012-11-08 ENCOUNTER — Encounter: Payer: Self-pay | Admitting: Family Medicine

## 2013-05-15 ENCOUNTER — Other Ambulatory Visit: Payer: Self-pay | Admitting: Family Medicine

## 2013-09-19 IMAGING — US ULTRASOUND LEFT BREAST
1 series · 12 of 12 positions shown · non-contrast
Comparison: none

REASON FOR EXAM: LT BRST INVERTED NIPPLE AND YEARLY
COMMENTS:

PROCEDURE:     US  - US BREAST LEFT  - October 25, 2012  [DATE]
RESULT:     A directed ultrasound was performed of the periareolar renal or
retroareolar region of the left breast. No cystic or solid mass is
demonstrated. No suspicious ductal dilation is demonstrated. No shadowing
masses are seen.

[Series 1: ultrasound left breast · 0.08mm/px · 12 of 12 slices shown]
[im 1/12]
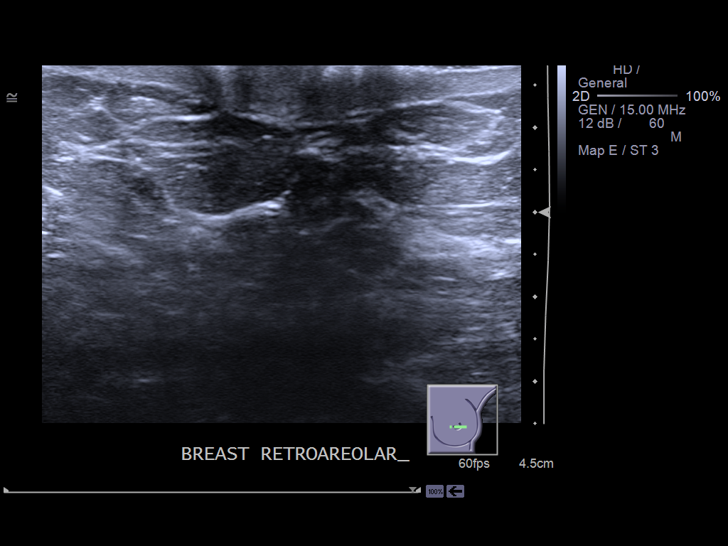
[im 2/12]
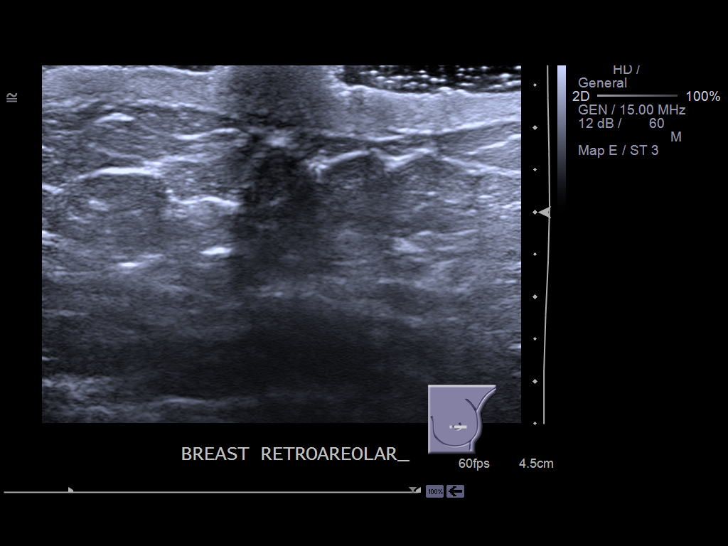
[im 3/12]
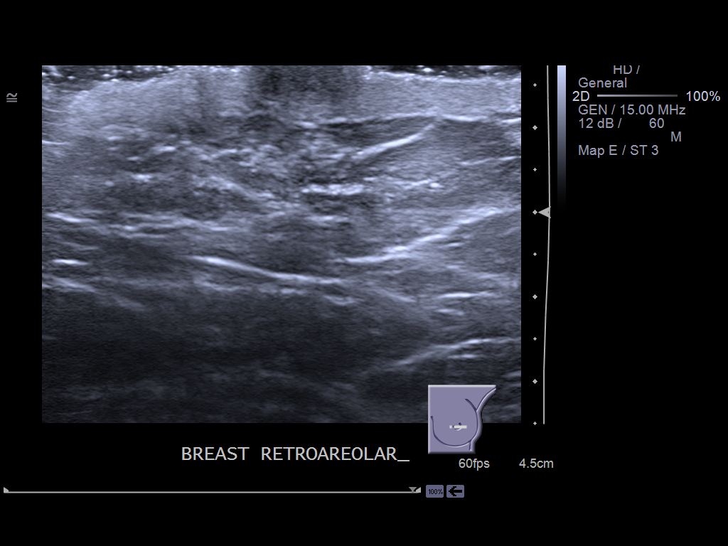
[im 4/12]
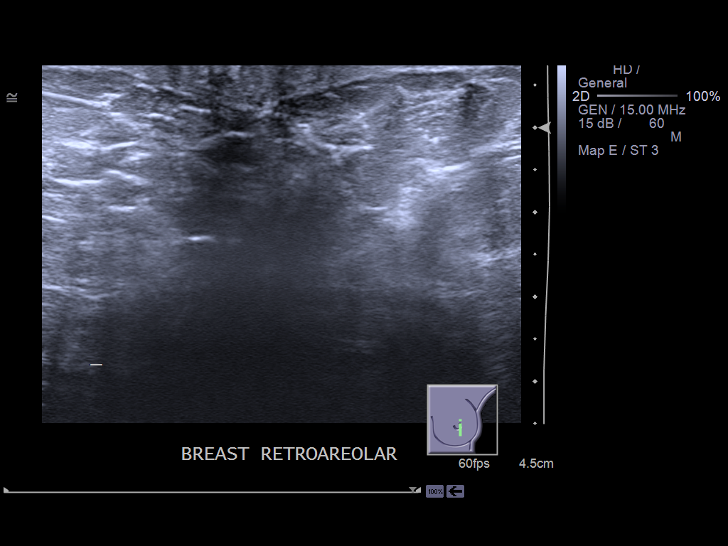
[im 5/12]
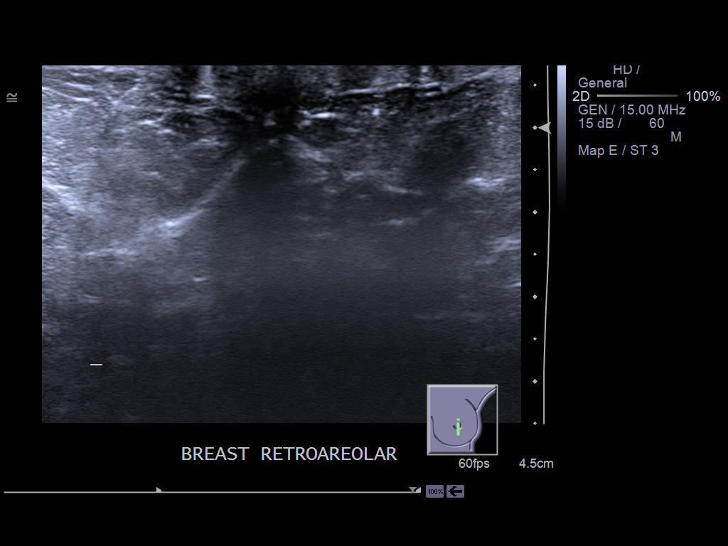
[im 6/12]
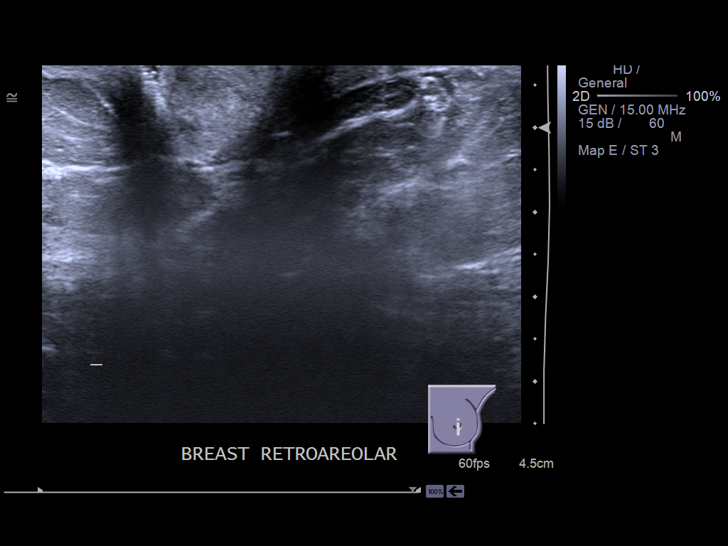
[im 7/12]
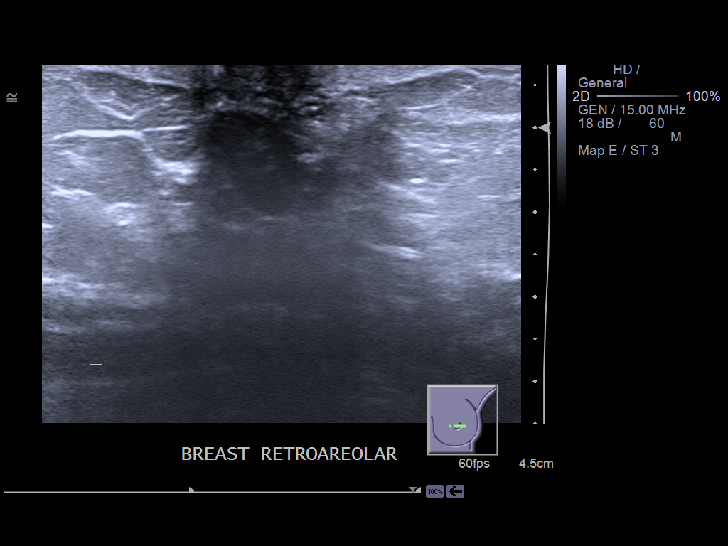
[im 8/12]
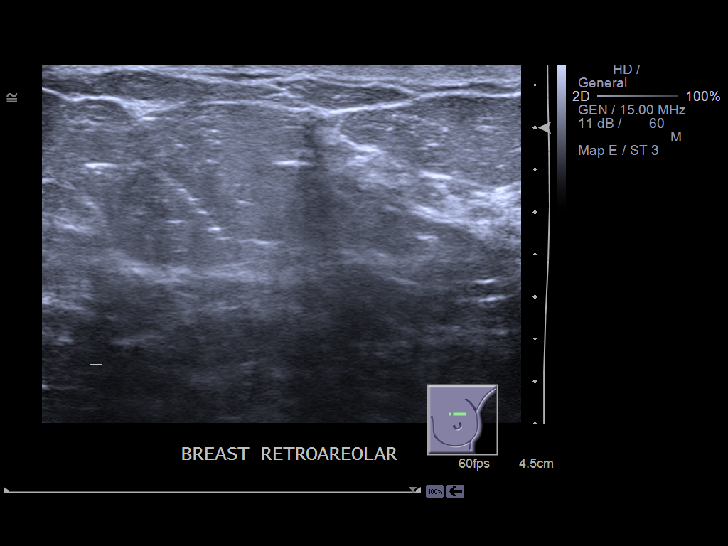
[im 9/12]
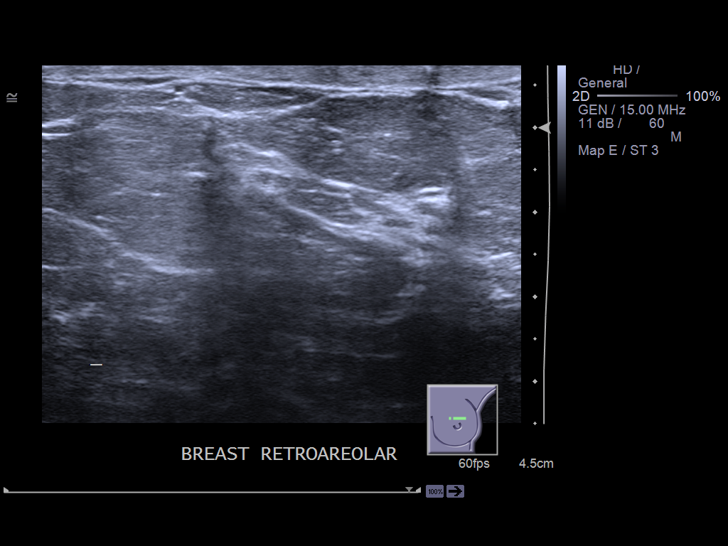
[im 10/12]
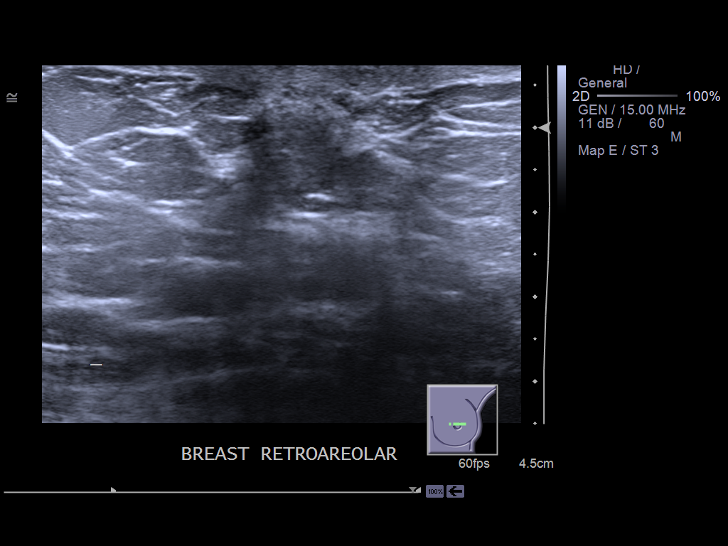
[im 11/12]
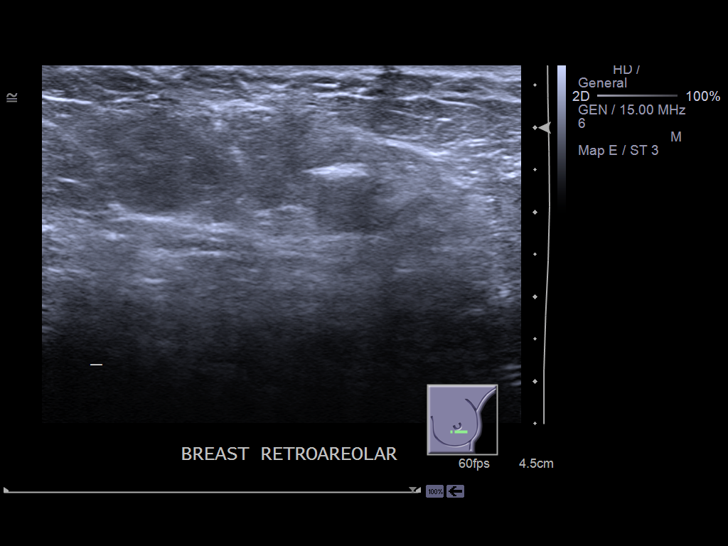
[im 12/12]
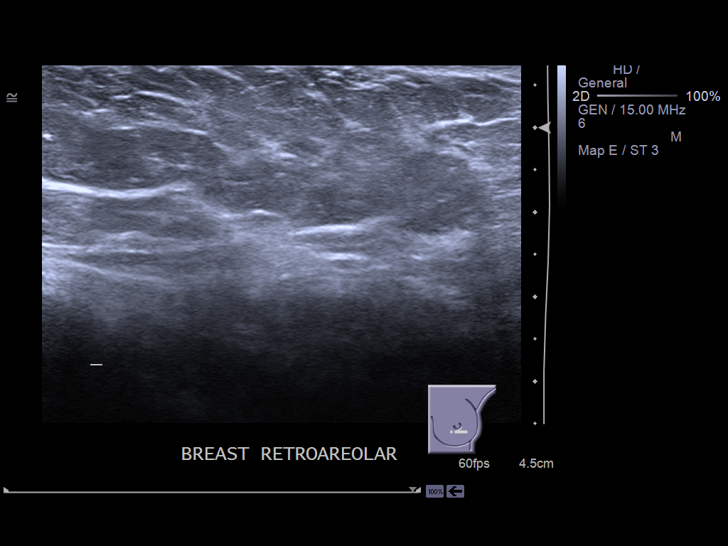

[12 of 12 positions shown; findings below may reference images not displayed]

IMPRESSION: No acute abnormality is seen in the retroareolar region of
the left breast. Please see the dictation of the diagnostic mammogram of the
same day for final recommendations and BI-RADS classification.

 Dictation site:1

## 2013-09-27 ENCOUNTER — Other Ambulatory Visit: Payer: Self-pay

## 2013-11-23 ENCOUNTER — Other Ambulatory Visit: Payer: Self-pay

## 2013-11-23 NOTE — Telephone Encounter (Signed)
Pt left v/m [place] [facility] [place] dr there in 11/2013 and pt request refills for hctz and lo estrin FE for one month. Pt did not leave pharmacy info but will give that info when pt receives cb.Please advise.

## 2013-11-23 NOTE — Telephone Encounter (Signed)
Ok to refill one time only. 

## 2013-11-26 MED ORDER — HYDROCHLOROTHIAZIDE 25 MG PO TABS: ORAL_TABLET | ORAL | Status: AC

## 2013-11-26 MED ORDER — NORETHIN-ETH ESTRAD-FE BIPHAS 1 MG-10 MCG / 10 MCG PO TABS: 1.0000 | ORAL_TABLET | Freq: Every day | ORAL | Status: AC

## 2013-11-26 NOTE — Telephone Encounter (Signed)
Ok to refill one time only. 

## 2013-11-26 NOTE — Telephone Encounter (Signed)
Pt requesting medication refills. Last ov 09/2012. pls advise

## 2013-11-26 NOTE — Telephone Encounter (Signed)
Rx refilled x 1 as requested

## 2019-04-03 ENCOUNTER — Telehealth: Payer: Self-pay | Admitting: Family Medicine

## 2019-04-03 NOTE — Telephone Encounter (Signed)
Called patient to see if she still wants to see Dr Dayton Martes as her PCP and if not we need to update in system and if she does we need to schedule an appt, left message
